# Patient Record
Sex: Female | Born: 1950 | Marital: Married
Health system: Southern US, Community
[De-identification: ages and names within clinical notes are randomized; demographics above are authoritative.]

---

## 2011-06-13 ENCOUNTER — Emergency Department: Payer: Self-pay | Admitting: Unknown Physician Specialty

## 2011-09-01 LAB — COMPREHENSIVE METABOLIC PANEL
Albumin: 3.9 g/dL (ref 3.4–5.0)
Alkaline Phosphatase: 80 U/L (ref 50–136)
Bilirubin,Total: 0.4 mg/dL (ref 0.2–1.0)
Calcium, Total: 8.8 mg/dL (ref 8.5–10.1)
Creatinine: 0.64 mg/dL (ref 0.60–1.30)
EGFR (African American): >60
Glucose: 103 mg/dL — ABNORMAL HIGH (ref 65–99)
SGOT(AST): 27 U/L (ref 15–37)
Sodium: 145 mmol/L (ref 136–145)
Total Protein: 6.8 g/dL (ref 6.4–8.2)

## 2011-09-01 LAB — CBC
HCT: 37.5 % (ref 35.0–47.0)
HGB: 12.6 g/dL (ref 12.0–16.0)
MCH: 30.9 pg (ref 26.0–34.0)
MCV: 92 fL (ref 80–100)
Platelet: 213 10*3/uL (ref 150–440)
WBC: 8.3 10*3/uL (ref 3.6–11.0)

## 2011-09-01 LAB — ACETAMINOPHEN LEVEL: Acetaminophen: 3 ug/mL — ABNORMAL LOW

## 2011-09-01 LAB — TSH: Thyroid Stimulating Horm: 1.67 u[IU]/mL

## 2011-09-01 LAB — ETHANOL: Ethanol %: 0.057 % (ref 0.000–0.080)

## 2011-09-02 ENCOUNTER — Observation Stay: Payer: Self-pay | Admitting: Internal Medicine

## 2011-09-02 LAB — URINALYSIS, COMPLETE
Bilirubin,UR: NEGATIVE
Ph: 5 (ref 4.5–8.0)
Protein: NEGATIVE
Specific Gravity: 1.009 (ref 1.003–1.030)
Squamous Epithelial: 1
WBC UR: 1 /HPF (ref 0–5)

## 2011-09-02 LAB — DRUG SCREEN, URINE
Amphetamines, Ur Screen: NEGATIVE (ref ?–1000)
Benzodiazepine, Ur Scrn: NEGATIVE (ref ?–200)
Cannabinoid 50 Ng, Ur ~~LOC~~: NEGATIVE (ref ?–50)
Cocaine Metabolite,Ur ~~LOC~~: NEGATIVE (ref ?–300)
Opiate, Ur Screen: POSITIVE (ref ?–300)

## 2011-09-03 ENCOUNTER — Inpatient Hospital Stay: Payer: Self-pay | Admitting: Psychiatry

## 2012-02-05 ENCOUNTER — Emergency Department: Payer: Self-pay | Admitting: Emergency Medicine

## 2012-02-05 LAB — COMPREHENSIVE METABOLIC PANEL
Albumin: 3.9 g/dL (ref 3.4–5.0)
Alkaline Phosphatase: 90 U/L (ref 50–136)
BUN: 12 mg/dL (ref 7–18)
Bilirubin,Total: 0.3 mg/dL (ref 0.2–1.0)
Calcium, Total: 8.8 mg/dL (ref 8.5–10.1)
Chloride: 107 mmol/L (ref 98–107)
Co2: 24 mmol/L (ref 21–32)
Osmolality: 291 (ref 275–301)
SGOT(AST): 24 U/L (ref 15–37)
SGPT (ALT): 23 U/L (ref 12–78)

## 2012-02-05 LAB — ETHANOL: Ethanol %: 0.019 % (ref 0.000–0.080)

## 2012-02-05 LAB — CBC
HCT: 38.9 % (ref 35.0–47.0)
HGB: 13.1 g/dL (ref 12.0–16.0)
MCH: 30.8 pg (ref 26.0–34.0)
MCHC: 33.6 g/dL (ref 32.0–36.0)
MCV: 92 fL (ref 80–100)
Platelet: 249 10*3/uL (ref 150–440)
RBC: 4.25 10*6/uL (ref 3.80–5.20)

## 2012-02-06 LAB — DRUG SCREEN, URINE
Amphetamines, Ur Screen: NEGATIVE (ref ?–1000)
Barbiturates, Ur Screen: NEGATIVE (ref ?–200)
Cocaine Metabolite,Ur ~~LOC~~: NEGATIVE (ref ?–300)
MDMA (Ecstasy)Ur Screen: NEGATIVE (ref ?–500)
Methadone, Ur Screen: NEGATIVE (ref ?–300)
Opiate, Ur Screen: NEGATIVE (ref ?–300)
Phencyclidine (PCP) Ur S: NEGATIVE (ref ?–25)
Tricyclic, Ur Screen: NEGATIVE (ref ?–1000)

## 2012-02-06 LAB — URINALYSIS, COMPLETE
Bacteria: NONE SEEN
Bilirubin,UR: NEGATIVE
Glucose,UR: NEGATIVE mg/dL (ref 0–75)
Leukocyte Esterase: NEGATIVE
Nitrite: NEGATIVE
Specific Gravity: 1.005 (ref 1.003–1.030)
WBC UR: NONE SEEN /HPF (ref 0–5)

## 2013-04-22 IMAGING — CR RIGHT ANKLE - COMPLETE 3+ VIEW
1 series · 5 of 5 positions shown · non-contrast
Comparison: none

REASON FOR EXAM: pain
COMMENTS:

PROCEDURE:     DXR - DXR ANKLE RIGHT COMPLETE  - June 13, 2011  [DATE]
RESULT:

[Series 1: ap · 0.17mm/px · 5 of 5 slices shown]
[im 1/5]
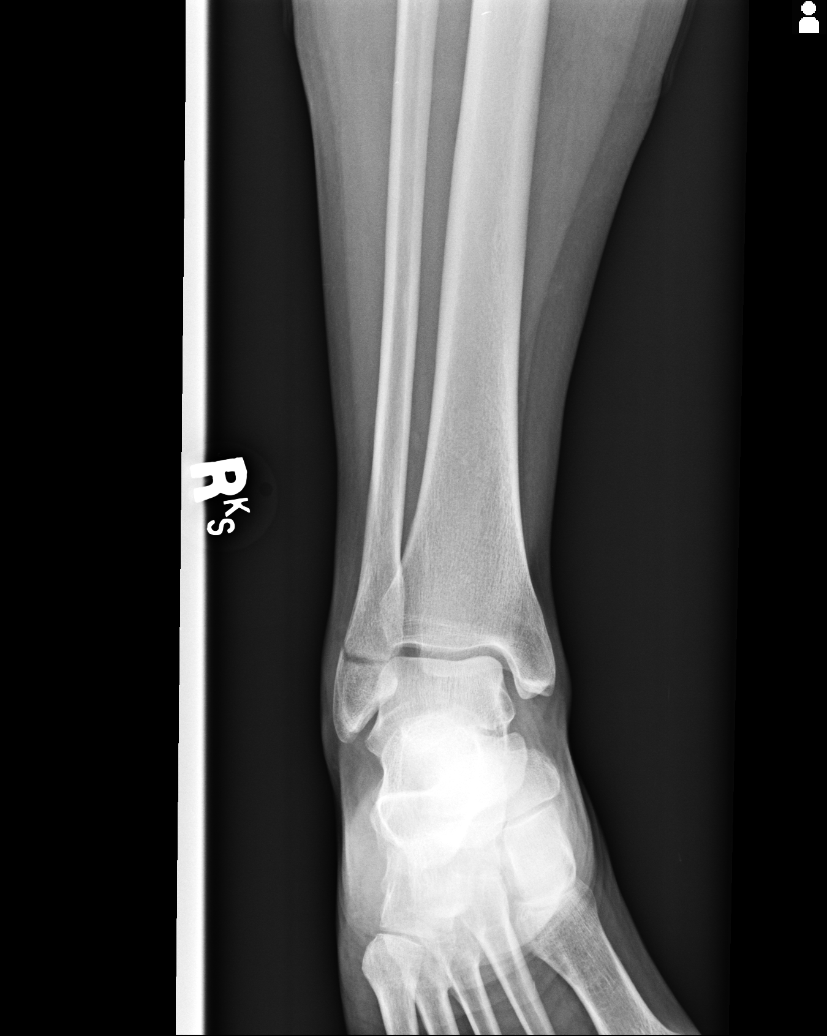
[im 2/5]
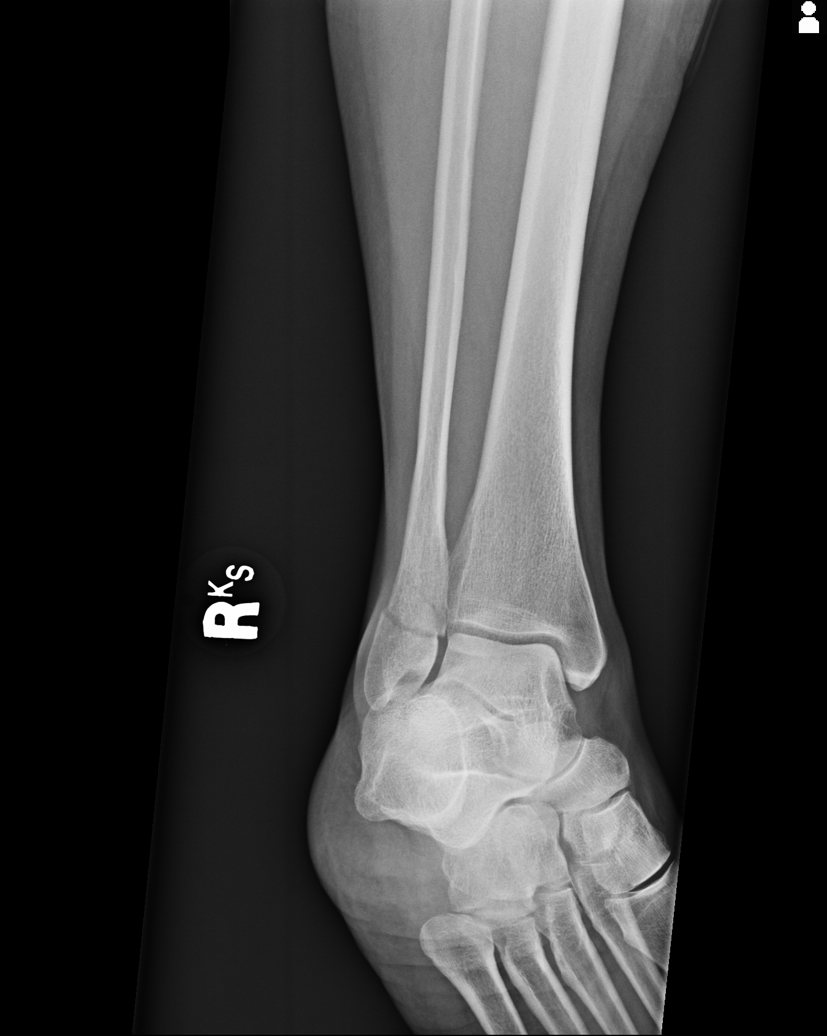
[im 3/5]
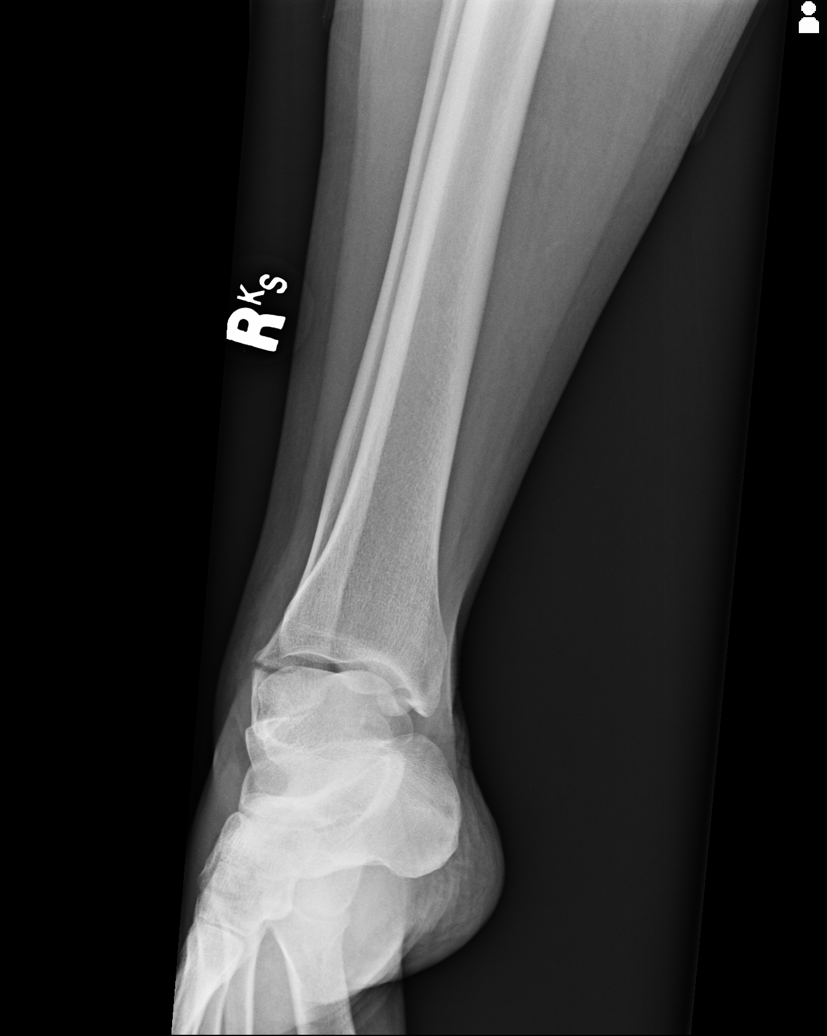
[im 4/5]
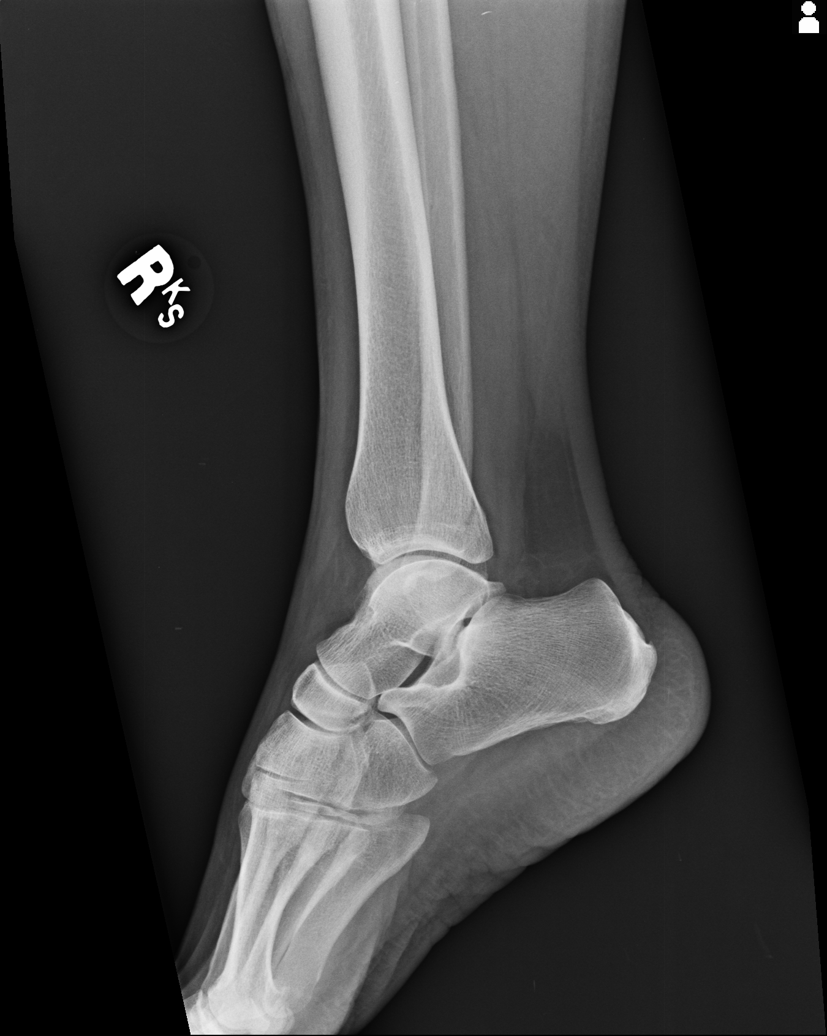
[im 5/5]
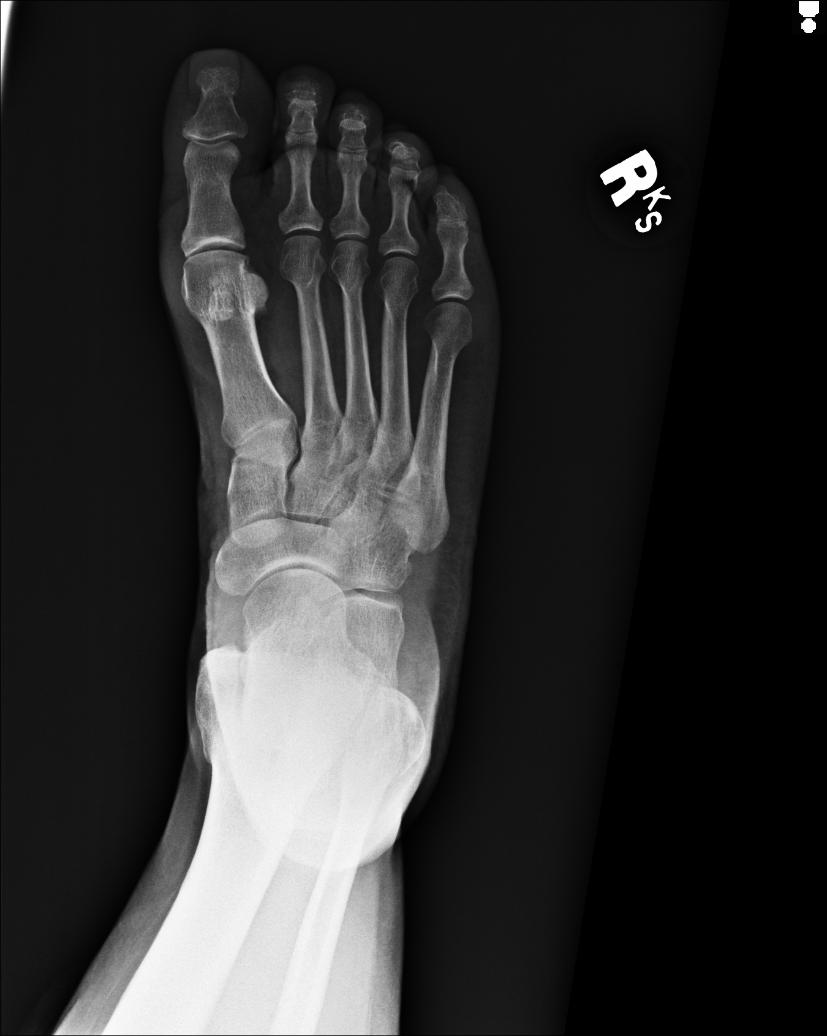

[5 of 5 positions shown; findings below may reference images not displayed]

FINDINGS: A minimally displaced transverse fracture is identified involving
the lateral malleolus. There is intra-articular extension. The ankle mortise
is otherwise intact. A spiral component extends into the distal fibula.
IMPRESSION: Medial malleolar fracture.

## 2014-09-08 NOTE — Discharge Summary (Signed)
PATIENT NAMPercival Goodman:  Huhta, Jerry MR#:  [id number] DATE OF BIRTH:  May 14, 1951  DATE OF ADMISSION:  09/03/2011 DATE OF DISCHARGE:  09/06/2011  HOSPITAL COURSE: See dictated history and physical for details of admission. This 64 year old woman was admitted in transfer from the medical service because of an overdose on a possible combination of anxiety and narcotic pain medicines as well as alcohol. She was depressed and anxious. Since transfer to the psychiatry ward, she has not voiced any suicidal ideation. She has been cooperative with treatment. She was continued on Paxil 20 mg a day, her usual outpatient medicine. Patient attended group therapy and had individual counseling daily. She spoke with her husband and feels that she is working on reconciling her anger at him. At the time of discharge she totally denies any suicidal ideation. She shows improved insight and agrees to the proposition that she should stop drinking at least for the time being. Patient was educated about the use of benzodiazepines and narcotics and strongly encouraged to avoid any intoxicating drugs. She agreed to all of this. She was to follow up with outpatient treatment with a private psychiatrist and her marriage counselor whom she is already seeing.   LABORATORY, DIAGNOSTIC AND RADIOLOGICAL DATA: Minimal labs were done since transfer. Most recent magnesium level 1.8, in the normal range. HIV testing result is still pending. This was ordered because of her concern about her husband's infidelity. Drug screen on admission had been positive for opiates.   DISCHARGE MEDICATION: Paxil 20 mg per day.   DISPOSITION: As above she is to stay at home. She and her husband are to continue living together. She has arrangements to see a psychiatrist and a therapist.   MENTAL STATUS EXAM: Neatly dressed and groomed. Good eye contact. Normal psychomotor activity. Speech normal in rate, tone, and volume. Affect is euthymic, reactive, appropriate.  Mood stated as being better. Thoughts are lucid and directed. No evidence of loosening of associations or delusional thinking. Denies suicidal or homicidal ideation. Improved insight.   DIAGNOSIS PRINCIPLE AND PRIMARY:  AXIS I: Major depression, not otherwise specified.   SECONDARY DIAGNOSES:  AXIS I: Rule out alcohol and benzodiazepine and narcotic abuse.   AXIS II: Deferred.   AXIS III: No diagnosis.   AXIS IV: Severe stress from husband's infidelity.   AXIS V: Functioning at time of discharge 60.   ____________________________ Audery AmelJohn T. Clapacs, MD jtc:cms D: 09/06/2011 17:36:00 ET T: 09/07/2011 10:26:36 ET JOB#: [id number]  cc: Audery AmelJohn T. Clapacs, MD, <Dictator> Audery AmelJOHN T CLAPACS MD ELECTRONICALLY SIGNED 09/07/2011 10:51

## 2014-09-08 NOTE — Consult Note (Signed)
PATIENT NAMEJACARA, [name] MR#:  [id number] DATE OF BIRTH:  July 22, 1950  DATE OF CONSULTATION:  09/02/2011  REFERRING PHYSICIAN:   CONSULTING PHYSICIAN:  Audery Amel, MD  IDENTIFYING INFORMATION AND REASON FOR CONSULT: The patient is a 64 year old woman who was admitted to the hospital after an intentional overdose of Xanax. Consult is because of intentional overdose, possible suicidal intent.   CHIEF COMPLAINT: "I just feel so lonely."   HISTORY OF PRESENT ILLNESS: The patient was admitted to the hospital last night after being brought in having intentionally overdosed on Xanax. She reported Xanax and there was an empty bottle of Xanax with her. The drug screen interestingly was not positive, however for benzodiazepines, but rather has been positive for narcotics and alcohol. She admits that she also had been drinking several vodka and orange juices. The patient states that her mood has been distraught and upset recently. Over the last week or two she has caught her husband in the [place] of having an affair with another woman. She has reacted to this with depression, tearfulness, anger, helplessness. They did apparently start seeing a counselor earlier this week but even after that the patient says she became agitated and was fighting with her husband yesterday. She became increasingly emotional and says that she just wanted to sleep, which is why she took pills but she also admits that she had left a message to say goodbye to her adult children before doing it. The patient states that her mood recently has been very upset. She has not been able to sleep for several days. Tearful much of the time. Consumed by angry depressive negative thoughts. She denies having any wish to actually kill herself but does feel hopeless and helpless. Does not report any homicidal ideation, although she does report a lot of anger towards her husband and the woman he has apparently had an affair with. The patient says that  she takes Paroxetine 20 mg a day and has been doing so regularly without any change. She admits also that recently she has been drinking an increased amount. She says that she did not used to drink until she married her current husband about four years ago and since then her drinking has escalated to the point where she admits that it has been a problem at times. She denied to me that she was using any narcotics or had any narcotic medications.   PAST PSYCHIATRIC HISTORY: Denies any previous psychiatric hospitalization or suicide attempts. She says that she has been treated for depression and anxiety. Does not report OCD or full-blown panic attacks. Most recent mood problems she attributes directly to her anger with her husband. Denies any history of violence towards others in the past.   SOCIAL HISTORY: The patient has been married to her current husband for about four years. This is her second marriage. Previously she was married for about 30 years and has two adult children. She used to work the city of [place] but left her job last fall. She indicates that she did that because she felt like she was being treated badly and driven out at her job. All the details of that are unclear.   PAST MEDICAL HISTORY: The patient denies any significant ongoing medical problems.   CURRENT MEDICATIONS: Claims she takes Paxil 20 mg a day. Says that she had the Xanax but was not taking it regularly. She does report that she injured her ankle several months ago which may have been the source of  the narcotics that that got. That seems to have healed up.   SUBSTANCE ABUSE HISTORY: Admits that she has been drinking more heavily over the past few years and especially over the last week or two has been drinking quite a bit more. She denies ever having gotten any kind of specific treatment for it in the past but does say that it has occurred to her this has been a problem. Denies abusing any other drugs.   ALLERGIES:  No known drug allergies.   REVIEW OF SYSTEMS: Complains of feeling depressed, tearful, upset, tired currently but recently has not been able to sleep well. Denies auditory or visual hallucinations. Denies suicidal ideation. Vague about the exact reasons why she overdosed. Denies homicidal ideation. Denies any other specific physical symptoms right now.   MENTAL STATUS EXAM: Interviewed in her hospital room. She was cooperative with the interview. Eye contact was good. Psychomotor activity was limited. Affect was tearful, distraught, upset, demonstrative. Sobbing much of the time. Thoughts were lucid. No obvious loosening of associations, although of course she does focus a great deal on the details of her husband's infidelity. No obvious bizarre or delusional thinking. Denies hallucinations. Denies current suicidal or homicidal ideation. Judgment and insight somewhat impaired possibly.   PHYSICAL EXAMINATION: A full physical exam is not indicated for consult. I do note that her blood pressure is running normal right now at 116/67, pulse 68. No fever. Seems to be able to move all of her extremities. Cranial nerves all appear intact.   ASSESSMENT: 64 year old woman took an intentional overdose. She claims that it was Xanax along with alcohol although the drug screen would suggest that there were narcotics involved. I checked her record on the [place] controlled substances data base. Interestingly, there is no prescription for Xanax in the last six months that I saw a record of but I did see several prescriptions for narcotic pain medicines all clustered in January of this year. The patient gives a history that does suggest at least the possibility of serious suicidal intent. Has ongoing severe stress. Affect tearful and upset and a little bit disorganized. Seems to still have some significant risk for self injury. Requires further stabilization.   TREATMENT PLAN: I am going to restart the Paxil  according to what she said. Some supportive and educational therapy done. Under the current circumstances I think she should be admitted to the psychiatry ward for stabilization because of acute suicidality. I would continue the sitter for now. Please notify our service once she is ready to be discharged so that we can work on transfer to psychiatry. I am hoping that she will sign voluntarily. I am not sure that there has been any involuntary order done. If not, that may have to be done.   DIAGNOSIS PRINCIPLE AND PRIMARY:  AXIS I: Adjustment disorder with mixed disturbance of behavior and mood, rule out major depression, rule out depression not otherwise specified.   SECONDARY DIAGNOSES:  AXIS I: Rule out alcohol intoxication or other substance abuse.   AXIS II: Deferred.   AXIS III: No known ongoing medical problems.   AXIS IV: Severe stress from conflict with her husband as noted above.   AXIS V: Functioning at time of evaluation 30.  ____________________________ Audery AmelJohn T. Natesha Hassey, MD jtc:rbg D: 09/02/2011 15:57:25 ET T: 09/02/2011 16:25:43 ET JOB#: [id number]  cc: Audery AmelJohn T. Sadat Sliwa, MD, <Dictator> Audery AmelJOHN T Jarett Dralle MD ELECTRONICALLY SIGNED 09/03/2011 17:12

## 2014-09-08 NOTE — H&P (Signed)
PATIENT NAMPercival Spanish:  [name], [name] MR#:  [id number] DATE OF BIRTH:  1951-03-24  DATE OF ADMISSION:  09/02/2011  REFERRING PHYSICIAN: Dr. Mindi JunkerGottlieb.  PRIMARY CARE PHYSICIAN:  Unknown.   PRESENTING COMPLAINT: Intentional drug overdose.   HISTORY OF PRESENT ILLNESS: [name] is a 64 year old woman with history of depression, unknown other medical problems, who is brought in after the patient took an overdose of Xanax, unsure of the amount. No family members in the room. The patient is somnolent and is not cooperating or providing history. It appears that she found out her husband was having an extramarital affair and locked herself in the bathroom with a bottle of vodka and Xanax and overdosed on Xanax but, again, the patient has not been up to communicate the amount. She does not know why she is here. She knows the year, but when asked on interview she answers the same answers for different questions.   PAST MEDICAL HISTORY:  Depression. Unknown of any other medical problems.   PAST SURGICAL HISTORY: Unknown.   ALLERGIES: No known drug allergies.   MEDICATIONS: Paxil, unknown dose, and not quite sure where she had access to the Xanax.   FAMILY HISTORY: Unknown. The patient is unable to provide.   SOCIAL HISTORY: She lives with her husband. No known tobacco or alcohol or drug use.   REVIEW OF SYSTEMS: Unable to obtain due to the patient's altered mental status.   PHYSICAL EXAMINATION:  VITAL SIGNS: Temperature 99.3, pulse 93, respiratory rate 20, blood pressure 154/76, blood pressure dropped to 90s over 60s, sating at 93% on room air.   GENERAL: Lying in bed, somnolent, in no apparent distress.   HEENT: Normocephalic, atraumatic. Pupils are dilated and equal and responsive.   NECK: Soft and supple. No adenopathy or JVP.   HEART: Slightly tachy. No murmurs, rubs, or gallops.   LUNGS: Clear to auscultation bilaterally. No use of accessory muscles or increased respiratory effort.   ABDOMEN:  Soft. Positive bowel sounds. No mass appreciated.   EXTREMITIES: No edema. Dorsal pedis pulses intact.   MUSCULOSKELETAL: No joint effusion.   SKIN: No ulcers.   NEUROLOGIC: The patient is not cooperating with neurological exam.   PERTINENT LABS AND STUDIES: Urinalysis with specific gravity of 1.009, pH 5, leukocyte esterase negative, RBC one per high-power field, WBC one per high-power field. Urine drug screen positive for opiates, acetaminophen 3, glucose 103, BUN 13, creatinine 0.64, sodium 145, potassium 3.8, chloride 106, carbon dioxide 28, calcium 8.8. LFTs within normal limits. Alcohol level 57, WBC 8.3, hemoglobin 12.6, hematocrit 37.5, platelets 213, MCV 92, salicylate level less than 1.7. TSH 1.67.   ASSESSMENT AND PLAN: [name] is a 64 year old woman with a history of depression presenting after intentional overdose on Xanax.  1. Altered mental status in the setting of intentional overdose on Xanax, however, unknown amount. Unknown if this is the patient's first event or has had previous episodes, status post activated charcoal. Will observe on tele, continue neuro checks and seizure precautions. Her urine drug screen results are positive for opiates, but not benzodiazepine, therefore unclear. Will continue with sitter and obtain psychiatric consultation.  2. Relative hypotension, possibly in the setting of the benzodiazepine. IV fluids and follow closely.  3. Depression. As above, holding Paxil for now. Her TSH is within normal limits. Send mag level.  4. Prophylaxis with TEDs and SCDs.   TIME SPENT: Approximately 45 minutes were spent on patient care.  ____________________________ Reuel DerbyAlounthith Leslie Jester, MD ap:ap D: 09/02/2011 01:52:01 ET  T: 09/02/2011 07:00:37 ET JOB#: [id number]  cc: Reuel Derby, MD, <Dictator> Reuel Derby MD ELECTRONICALLY SIGNED 09/13/2011 0:25

## 2014-09-08 NOTE — H&P (Signed)
PATIENT NAMPercival Goodman:  [name], [name] MR#:  [id number] DATE OF BIRTH:  11-23-1950  DATE OF ADMISSION:  09/03/2011  IDENTIFYING INFORMATION: A 64 year old woman who was transferred from the Medical Observation Service because of overdose with suicidal behavior.   CHIEF COMPLAINT: "I feel so lonely."   HISTORY OF PRESENT ILLNESS: Information is obtained from the patient and from the chart. The patient was brought to the hospital after taking an intentional overdose. She had reported that it was an overdose of Xanax pills along with alcohol. Medical information suggest that it may have been narcotics, either in addition to or instead of the Xanax. The patient reports that she was doing it because she wanted to sleep but also admits that she was feeling very depressed and feeling like she wished she would not wake up. She was in the midst of an argument with her husband when she became frustrated with him and swallowed an overdose of pills. The patient reports that she recently has been having depressed mood, anxiety, difficulty sleeping, decreased appetite, passive suicidal thoughts, feelings of hopelessness, feelings of anger and rage, all present for the last week or more. This is the same time period in which she discovered that her husband had been having an affair with a woman in another city. The patient and her husband had both started marriage counseling because of this, but she became increasingly agitated despite this. In addition, the patient admits that she has been drinking a larger amount than usual recently. At the time of the overdose, she had probably had at least two vodka and orange juices.   PAST PSYCHIATRIC HISTORY: The patient has been under the care of a psychiatrist in the past year for treatment of depression. The depression diagnosis predates the discovery about her husband's infidelity. She has been treated with Paxil in the past and says she is continuing to take Paxil 20 mg a day. She has  found Paxil to be at least partially helpful. She denies any past history of suicide attempts. She denies any past history of hospitalizations. She believes she has probably been on other antidepressants in the past, although she cannot recall the specifics. She reports also that she had been given a prescription for the Xanax 0.25 mg pills to be used p.r.n. She admits that she has been increasing her alcohol use recently.   SUBSTANCE ABUSE HISTORY: The patient says she never had any kind of substance use problem until she married her current husband about four years ago. Since then, she has increased her drinking, although it seems like it has increased even more recently to the point where it is at least 2 or 3 drinks a day. No history of seizures or DTs. She has never been treated for alcohol abuse. She denies that she abuses any other drugs.   PAST MEDICAL HISTORY: The patient has no significant ongoing medical problems.   SOCIAL HISTORY: She is originally from [place] but has lived in [place] for over [place] years. She was married for 30 years to another man, but they divorced several years ago. She has been married to her current husband for about four years. She has two adult children, both sons, both professionals who do not live close by. She recently discovered that her current husband has been having an affair with another woman which has thrown her into a rage. The patient worked for the [employer] [place] for years. She resigned her job in the last year for unclear reasons.  FAMILY HISTORY: She denies any family history of mental illness.   CURRENT MEDICATIONS: Paxil 20 mg per day.   ALLERGIES: No known drug allergies.   REVIEW OF SYSTEMS:  She complains of being depressed, down, sad, tired. She has frequent crying spells. She denies active suicidal ideation. She denies hallucinations or delusions.   MENTAL STATUS EXAM: Eye contact today is good. Psychomotor activity is normal.  Speech is normal rate, tone, and volume. Affect is still blunted and dysphoric but less tearful and upset than when I saw her yesterday. Mood is stated as still being depressed. Thoughts are lucid with no evidence of loosening of associations or delusional thinking. She denies suicidal or homicidal ideation currently. She denies hallucinations. Adequate judgment and insight overall.   PHYSICAL EXAMINATION:  GENERAL: The patient appears her stated age.   SKIN: No acute distress.   VITAL SIGNS: Temperature 97.4, pulse 86, respirations 18, blood pressure currently 149/70.   SKIN: No skin lesions identified.   HEENT: Pupils are equal and reactive. Face is symmetric. Mucosa is normal.   NECK: Supple, nontender. Full range of motion in all extremities. Normal gait. Normal strength and reflexes throughout.   LUNGS: Clear to auscultation.   HEART: Regular rate and rhythm. No extra sounds.   ABDOMEN: Soft, nontender, normal bowel sounds.   ASSESSMENT: A 64 year old woman who took an impulsive overdose of medication yesterday, also in the context of drinking. Blood alcohol level was positive. Drug screen was positive for opiates. The patient has denied suicidal ideation but admits that she was thinking of not waking up. Multiple depressive symptoms, out of control behavior, needs hospitalization for stabilization and because of acute risk of danger.   TREATMENT PLAN:  1. Transfer to Psychiatry now that she is medically stable.  2. Continue Paxil 20 mg a day.  3. Engage in groups and activities on the Unit. Form rapport. Daily supportive therapy.  4. Get Social Work to meet with the family.  5. Confirm outpatient treatment.  6. Work on decreasing suicidal ideation.    DIAGNOSIS, PRINCIPAL AND PRIMARY:  AXIS I: Major depression moderate-to-severe, single episode.   SECONDARY DIAGNOSES:  AXIS I: Rule out alcohol abuse.   AXIS II: Deferred.   AXIS III: No diagnosis.   AXIS IV: Severe acute  stress from husband's infidelity, psychosocial stress difficulties.       AXIS V: Functioning at time of admission is 30.   ____________________________ Audery Amel, MD jtc:cbb D: 09/03/2011 18:33:04 ET T: 09/03/2011 18:50:47 ET JOB#: [id number]  cc: Audery Amel, MD, <Dictator> Audery Amel MD ELECTRONICALLY SIGNED 09/06/2011 9:42

## 2014-09-08 NOTE — Consult Note (Signed)
Details:    - Psychiatry: Patient will be transferred to psychiatry unit today for follow up stabilization. Will dictate intake note once discharge from observation is complete.   Electronic Signatures: Clapacs, Jackquline DenmarkJohn T (MD)  (Signed 19-Apr-13 12:07)  Authored: Details   Last Updated: 19-Apr-13 12:07 by Audery Amellapacs, John T (MD)

## 2014-09-08 NOTE — Discharge Summary (Signed)
PATIENT NAMPercival Goodman:  Hemmingway, Glennie MR#:  [id number] DATE OF BIRTH:  1951-03-03  DATE OF ADMISSION:  09/02/2011 DATE OF DISCHARGE:  09/03/2011  ADMITTING DIAGNOSIS: Intentional drug overdose.   DISCHARGE DIAGNOSES:  1. Intentional drug overdose on Xanax due to stressful situation at home.  2. Depression.  3. Hypotension, likely as a result of Xanax overdose, now normotensive.   LABORATORY, DIAGNOSTIC AND RADIOLOGICAL DATA: Urinalysis with specific gravity of 1.009, pH 5, leukocyte esterase negative, WBCs negative. TUDS were positive for opioids. Acetaminophen 3, glucose 103, BUN 13, creatinine 0.64, sodium 145, potassium 3.8, chloride 106, carbon dioxide 28, calcium 8.8. LFTs were normal. Alcohol level 57, WBC 8.3, hemoglobin 12.6. TSH 1.67. Chest x-ray showed no acute abnormality.   CONSULTANT: Psychiatry.   HOSPITAL COURSE: Please see history and physical done by the admitting physician. Patient is a 64 year old female with history of depression who took an overdose on Xanax, unsure of the amount. Patient had found out that her husband was having an extramarital affair and locked herself in the bathroom with a bottle of vodka and Xanax and overdosed on Xanax. Patient was brought with these symptoms and we were asked to admit and monitor the patient. There was some hypotension initially, was given IV fluids and resolved. She was seen by psychiatry. They did feel that she needed further psychiatric inpatient treatment. Patient at this time medically is stable to be discharged to psychiatry.   DISCHARGE MEDICATIONS:  1. Tylenol 650 q.4 p.r.n.   2. Paxil 20 daily.   DIET: Regular.   ACTIVITY: As tolerated.   DISCHARGE FOLLOW UP: Psychiatry discharge once discharged from behavioral unit to be determined.   TIME SPENT: 30 minutes spent.  ____________________________ Lacie ScottsShreyang H. Allena KatzPatel, MD shp:cms D: 09/03/2011 09:27:51 ET T: 09/03/2011 09:47:21 ET JOB#: [id number]  cc: Angelito Hopping H. Allena KatzPatel, MD,  <Dictator> Charise CarwinSHREYANG H Lyly Canizales MD ELECTRONICALLY SIGNED 09/08/2011 13:06

## 2014-09-08 NOTE — Consult Note (Signed)
Brief Consult Note: Diagnosis: adjustment disorder with mood and behavioral disturbance vs depression nos.   Patient was seen by consultant.   Consult note dictated.   Recommend further assessment or treatment.   Orders entered.   Comments: Psychiatry: Patient seen. Reviewed chart and full interview done. Patient took intentional overdose with behavior suggesting at least possibility of suicidal intent. Tearful, very stressed. Suggest she be referred to inpatient psychiatry once discharged from medicine service. Please advise me when she is to be discharged. Continue sitter for now. Continue paxil 20mg  per day as per her history.  Electronic Signatures: Tomeika Weinmann, Jackquline DenmarkJohn T (MD)  (Signed 18-Apr-13 15:46)  Authored: Brief Consult Note   Last Updated: 18-Apr-13 15:46 by Audery Amellapacs, Cedar Ditullio T (MD)
